# Patient Record
Sex: Male | Born: 1950 | Race: White | Hispanic: No | Marital: Married | Smoking: Never smoker
Health system: Southern US, Community
[De-identification: ages and names within clinical notes are randomized; demographics above are authoritative.]

## PROBLEM LIST (undated history)

## (undated) DIAGNOSIS — I10 Essential (primary) hypertension: Secondary | ICD-10-CM

## (undated) DIAGNOSIS — E119 Type 2 diabetes mellitus without complications: Secondary | ICD-10-CM

## (undated) DIAGNOSIS — E7800 Pure hypercholesterolemia, unspecified: Secondary | ICD-10-CM

## (undated) HISTORY — DX: Pure hypercholesterolemia, unspecified: E78.00

## (undated) HISTORY — DX: Essential (primary) hypertension: I10

## (undated) HISTORY — DX: Type 2 diabetes mellitus without complications: E11.9

---

## 2003-09-20 ENCOUNTER — Ambulatory Visit (HOSPITAL_COMMUNITY): Admission: RE | Admit: 2003-09-20 | Discharge: 2003-09-20 | Payer: Self-pay | Admitting: Orthopedic Surgery

## 2004-06-06 ENCOUNTER — Ambulatory Visit (HOSPITAL_COMMUNITY): Admission: RE | Admit: 2004-06-06 | Discharge: 2004-06-07 | Payer: Self-pay | Admitting: Orthopaedic Surgery

## 2005-03-05 ENCOUNTER — Ambulatory Visit (HOSPITAL_COMMUNITY): Admission: RE | Admit: 2005-03-05 | Discharge: 2005-03-05 | Payer: Self-pay | Admitting: Orthopedic Surgery

## 2005-03-05 ENCOUNTER — Ambulatory Visit (HOSPITAL_BASED_OUTPATIENT_CLINIC_OR_DEPARTMENT_OTHER): Admission: RE | Admit: 2005-03-05 | Discharge: 2005-03-05 | Payer: Self-pay | Admitting: Orthopedic Surgery

## 2006-12-03 ENCOUNTER — Encounter: Admission: RE | Admit: 2006-12-03 | Discharge: 2006-12-03 | Payer: Self-pay | Admitting: Orthopedic Surgery

## 2010-12-07 NOTE — H&P (Signed)
NAMEANGELO, PRINDLE             ACCOUNT NO.:  [id number]   MEDICAL RECORD NO.:  [id number]          PATIENT TYPE:  OIB   LOCATION:  NA                           FACILITY:  [facility]   PHYSICIAN:  Sharolyn Douglas, M.D.        DATE OF BIRTH:  [birth year]   DATE OF ADMISSION:  06/06/2004  DATE OF DISCHARGE:                                HISTORY & PHYSICAL   CHIEF COMPLAINT:  Pain in my neck and shoulder.   HISTORY OF PRESENT ILLNESS:  This 60 year old white male is seen by [place] for  continued progressive problems concerning pain into his neck and shoulder  area.  This was more so on the left than the right.  He had been seen by Dr.  Vania Rea. Supple and underwent cervical decompression with a distal clavicle  resection in April of the left shoulder with some improvement.  Unfortunately, he continued with pain and discomfort. He was referred to [place]  by Dr. Vania Rea. Supple to rule out any cervical problems.  Unfortunately, he  did show degenerative changes most specifically at the C6/7 area level.  Foraminal stenosis was also seen.  MRI showed spondylosis and degenerative  disc disease. This was again at the C6/7 region.  The patient has developed  some mild limitation with range of motion of the cervical spine.  Spurling's  test is positive.  He has no gross weakness of the upper other than  decreased biceps flexion on the right.   Due to the persistent nature of this and despite the fact that his shoulder  surgery has not relieved his overall discomfort, it was felt he would  benefit with surgical intervention.  He is being admitted for anterior  cervical discectomy and fusion of C6 and C7 with plates and allograft.   PAST MEDICAL HISTORY:  This gentleman has been in relatively good health  throughout his life time.  His family physician is Dr. Aida Puffer in  [place], [place] who is treating him for hypertension, hyperlipidemia,  noninsulin-dependent diabetes mellitus, and gout.  He is  unsure of his  current medications but he takes a medication for each of the above.  He  will bring his list of medications to the hospital with him at the time of  his preoperative visit.   PAST SURGICAL HISTORY:  1.  Some sort of renal surgery back in 1969.  2.  Ankle surgery by Dr. Dionne Ano. Gramig in the not too distant past.  3.  Shoulder surgery in 2005 by Dr. Vania Rea. Supple as mentioned above.   FAMILY HISTORY:  Positive for hypertension and diabetes.  Negative for heart  disease.   SOCIAL HISTORY:  The patient is married.  He is a Location manager.  He has  no intake of tobacco or alcohol products.  No children.  His caregiver will  be his wife after surgery.   REVIEW OF SYMPTOMS:  CNS:  No seizure, paralysis, or double vision.  RESPIRATORY:  No productive cough.  No hemoptysis and no shortness of  breath.  GASTROINTESTINAL:  No nausea, vomiting,  melena, or bloody stool.  GENITOURINARY:  No discharge, hematuria, dysuria.  MUSCULOSKELETAL:  Primarily in present illness.   PHYSICAL EXAMINATION:  GENERAL:  Alert, cooperative and friendly.  Muscular,  well-dressed, and fully alert 60 year old white male.  VITAL SIGNS:  Blood pressure 140/88, pulse 80, respirations are 12.  HEENT:  Normocephalic.  PERRLA.  EOMI.  Oropharynx is clear.  NECK:  Supple.  No lymphadenopathy.  CHEST:  Clear to auscultation.  No rhonchi and no rales.  HEART:  Regular rate and rhythm.  No murmurs are heard.  ABDOMEN:  Somewhat obese, soft, nontender.  No liver tenderness.  No  abdominal tenderness.  Bowel sounds are present.  GENITOURINARY:  Not done and not pertinent to present illness.  EXTREMITIES:  Upper extremity as in present illness above as well as  cervical spine.   ADMISSION DIAGNOSES:  1.  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  2.  Noninsulin-dependent diabetes mellitus.  3.  Hyperlipidemia.  4.  Hypertension.  5.   Gout.   PLAN:  The patient will undergo anterior cervical discectomy with fusion at  C6/C7 with plates and allograft.  As I understand it from the patient, his  physician, Dr. Gaspar Garbe B. Little (who is hesitant to give him clearance for  surgery primarily because of his hyperlipidemia) wanted him to get his HDL  before the surgery and his glucose was high.  The patient said it was 140 at  his last test.  It is felt during this examination based on hopefully  relatively normal studies that preoperative labs that the patient can go  ahead with surgery.  We discussed further his hospital stay and  perioperative course.  Dr. Sharolyn Douglas has discussed with him the risks and  benefits of the surgery.  He is fitted today with a soft cervical collar  which he will use postoperatively.       DLU/MEDQ  D:  05/31/2004  T:  05/31/2004  Job:  [id number]

## 2010-12-07 NOTE — Op Note (Signed)
David Houston, David Houston             ACCOUNT NO.:  [id number]   MEDICAL RECORD NO.:  [id number]          PATIENT TYPE:  OIB   LOCATION:  5004                         FACILITY:  [facility]   PHYSICIAN:  Sharolyn Douglas, M.D.        DATE OF BIRTH:  11/08/1950   DATE OF PROCEDURE:  06/06/2004  DATE OF DISCHARGE:  06/07/2004                                 OPERATIVE REPORT   ADMISSION DIAGNOSIS:  Cervical spondylotic radiculopathy.   PROCEDURE:  1.  Anterior cervical diskectomy C6-7.  2.  Anterior cervical arthrodesis.  3.  C6-7 placement of allograft prosthesis spacer packed with local      autogenous bone graft.  4.  Anterior cervical plating C6-7 using the spine system.   SURGEON:  Sharolyn Douglas, M.D.   ASSISTANT:  Verlin Fester, P.A.   ANESTHESIA:  General endotracheal.   COMPLICATIONS:  None.   INDICATIONS:  The patient is a pleasant 60 year old male with persistent  neck and the left upper extremity pain.  He has cervical spondylosis,  foraminal narrowing at C6-7 and elected to undergo ACDF in hopes of  relieving his symptoms. Risks and benefits reviewed.   DESCRIPTION OF PROCEDURE:  The patient was identified holding it in the  operating room underwent general endotracheal anesthesia without difficulty.  Given prophylactic IV antibiotics.  Carefully positioned on the operating  room table with Mayfield head rest, 5 pounds halter traction applied. Neck  prepped, draped usual sterile fashion.  Transverse incision made left side  of the neck level of the cricoid cartilage.  Dissection was carried sharply  through the platysma interval between the SCM and strap muscles medially  developed down the prevertebral space.  The trachea and carotid sheath  identified and protected all times.  Spinal needle placed, and  intraoperative x-ray confirmed appropriate location.  The longus coli  muscles elevated out of the C6-7 disk space bilaterally.  Caspar distraction  pins were placed into the C6-C7  vertebral bodies. Distraction was applied.  Diskectomies carried back to posterior longitudinal ligament.  Wide  foraminotomies were completed.  Allograft prosthesis spacer was then packed  with local bone graft obtained from the drill shavings and carefully  countersunk into the interspace 1 mm.  Anterior cervical plate then applied  using four 12 mm screws. We ensured that the screw mechanism locked.  The  wound was irrigated.  The esophagus, trachea, carotid sheath were examined.  There was no apparent injuries.  Penrose drain left in place. The layer  closed with 2-0 Vicryl suture.  Subcutaneous layer closed with 3-0 followed  by  a running 4-0 subcuticular Vicryl suture on skin edges. Benzoin, Steri-  Strips placed. Sterile dressing applied. The patient was placed through a  cervical collar and extubated without difficulty, transferred to recovery in  stable condition able to move his upper and lower extremities.      MC/MEDQ  D:  11/08/2004  T:  11/09/2004  Job:  [id number]   cc:   Sharolyn Douglas, M.D.  [address]  [place]  [place] [address]  Fax: [phone]

## 2010-12-07 NOTE — Op Note (Signed)
David Houston, David Houston             ACCOUNT NO.:  [id number]   MEDICAL RECORD NO.:  [id number]          PATIENT TYPE:  AMB   LOCATION:  [facility]                         [facility]ACILITY:  [facility]   PHYSICIAN:  Marlowe Kays, M.D.  DATE OF BIRTH:  [birth year]   DATE OF PROCEDURE:  03/05/2005  DATE OF DISCHARGE:                                 OPERATIVE REPORT   PREOPERATIVE DIAGNOSES:  Metallic foreign body left extensor forearm.   POSTOPERATIVE DIAGNOSES:  Metallic foreign body left extensor forearm.   PROCEDURE:  Removal of metallic foreign body (small piece of fine wire) left  extensor forearm.   SURGEON:  Marlowe Kays, M.D.   ASSISTANT:  Nurse.   ANESTHESIA:  General.   PATHOLOGY AND JUSTIFICATION FOR PROCEDURE:  The original injury was on the  job on February 15, 2005. He has had x-rays demonstrating a very small piece of  metallic foreign body in the extensor forearm. Because of persistent pain,  he is here today desiring removal.   DESCRIPTION OF PROCEDURE:  Satisfied general anesthesia, pneumatic  tourniquet, arm was esmarched out sterilely and prepped from tourniquet to  wrist with DuraPrep, draped in a sterile field. I initially used the mini C-  arm and we thought we had localized the foreign body more on the extensor  ulnar side than I would have anticipated. I made a small incision there, but  we could not find the foreign body and since this was not his area  tenderness, I brought in the main C-arm and with a good bit of searching and  playing with the magnification and contrast, we were able to find what  appeared to be the foreign body lying more in the area of tenderness. This  was marked out with instruments and I then made a small incision there and  did indeed find a small foreign piece of metal right beneath the  subcutaneous layer. After removing this, it did fragment. The patient wanted  some and we gave him the portion that we could find. It was just so small  and  fragile that it was difficult to even find it on the operating  instrument table. We did document the removal with x-rays which were given  to the patient as well as I am keeping copies. The wounds were then  irrigated with sterile saline. The original wound was reapproximated with  interrupted 3-0 Vicryl in the subcutaneous tissue and 4-0 nylon in the skin  and the second with 4-0 nylon in the skin. Betadine Adaptic dry sterile  dressing were applied, tourniquet was released. He tolerated the procedure  well and was taken to the recovery room in satisfactory condition with no  known complications.           ______________________________  Marlowe Kays, M.D.     JA/MEDQ  D:  03/05/2005  T:  03/05/2005  Job:  [id number]

## 2020-04-17 ENCOUNTER — Encounter (HOSPITAL_COMMUNITY): Payer: Self-pay | Admitting: *Deleted

## 2020-04-17 ENCOUNTER — Emergency Department (HOSPITAL_COMMUNITY)
Admission: EM | Admit: 2020-04-17 | Discharge: 2020-04-18 | Disposition: A | Discharge status: Home / Self Care | Payer: Medicare Other | Attending: Emergency Medicine | Admitting: Emergency Medicine

## 2020-04-17 ENCOUNTER — Emergency Department (HOSPITAL_COMMUNITY): Payer: Medicare Other

## 2020-04-17 ENCOUNTER — Other Ambulatory Visit: Payer: Self-pay

## 2020-04-17 DIAGNOSIS — I10 Essential (primary) hypertension: Secondary | ICD-10-CM | POA: Insufficient documentation

## 2020-04-17 DIAGNOSIS — Z23 Encounter for immunization: Secondary | ICD-10-CM | POA: Diagnosis not present

## 2020-04-17 DIAGNOSIS — U071 COVID-19: Secondary | ICD-10-CM | POA: Insufficient documentation

## 2020-04-17 DIAGNOSIS — E119 Type 2 diabetes mellitus without complications: Secondary | ICD-10-CM | POA: Diagnosis not present

## 2020-04-17 DIAGNOSIS — R509 Fever, unspecified: Secondary | ICD-10-CM | POA: Diagnosis present

## 2020-04-17 LAB — CBC WITH DIFFERENTIAL/PLATELET
Abs Immature Granulocytes: 0.05 10*3/uL (ref 0.00–0.07)
Basophils Absolute: 0 10*3/uL (ref 0.0–0.1)
Basophils Relative: 0 %
Eosinophils Absolute: 0 10*3/uL (ref 0.0–0.5)
Eosinophils Relative: 0 %
HCT: 42.4 % (ref 39.0–52.0)
Hemoglobin: 13.8 g/dL (ref 13.0–17.0)
Immature Granulocytes: 1 %
Lymphocytes Relative: 25 %
Lymphs Abs: 1.7 10*3/uL (ref 0.7–4.0)
MCH: 28.9 pg (ref 26.0–34.0)
MCHC: 32.5 g/dL (ref 30.0–36.0)
MCV: 88.7 fL (ref 80.0–100.0)
Monocytes Absolute: 0.8 10*3/uL (ref 0.1–1.0)
Monocytes Relative: 11 %
Neutro Abs: 4.4 10*3/uL (ref 1.7–7.7)
Neutrophils Relative %: 63 %
Platelets: 174 10*3/uL (ref 150–400)
RBC: 4.78 MIL/uL (ref 4.22–5.81)
RDW: 13.4 % (ref 11.5–15.5)
WBC: 7 10*3/uL (ref 4.0–10.5)
nRBC: 0 % (ref 0.0–0.2)

## 2020-04-17 LAB — COMPREHENSIVE METABOLIC PANEL
ALT: 35 U/L (ref 0–44)
AST: 55 U/L — ABNORMAL HIGH (ref 15–41)
Albumin: 3.9 g/dL (ref 3.5–5.0)
Alkaline Phosphatase: 40 U/L (ref 38–126)
Anion gap: 13 (ref 5–15)
BUN: 21 mg/dL (ref 8–23)
CO2: 30 mmol/L (ref 22–32)
Calcium: 8.9 mg/dL (ref 8.9–10.3)
Chloride: 92 mmol/L — ABNORMAL LOW (ref 98–111)
Creatinine, Ser: 1.03 mg/dL (ref 0.61–1.24)
GFR calc Af Amer: >60 mL/min (ref >60)
GFR calc non Af Amer: >60 mL/min (ref >60)
Glucose, Bld: 77 mg/dL (ref 70–99)
Potassium: 3.9 mmol/L (ref 3.5–5.1)
Sodium: 135 mmol/L (ref 135–145)
Total Bilirubin: 1.1 mg/dL (ref 0.3–1.2)
Total Protein: 7.1 g/dL (ref 6.5–8.1)

## 2020-04-17 MED ORDER — ACETAMINOPHEN 325 MG PO TABS
650.0000 mg | ORAL_TABLET | Freq: Once | ORAL | Status: AC | PRN
  Administered 2020-04-17: 650 mg via ORAL
  Filled 2020-04-17: qty 2

## 2020-04-17 NOTE — ED Triage Notes (Signed)
Pt sent here from [facility]. Pt having bodyaches, fever, cough x 1 week. Tested covid + today and sent for further eval. Has fever on arrival, spo2 99%.

## 2020-04-18 DIAGNOSIS — R509 Fever, unspecified: Secondary | ICD-10-CM | POA: Diagnosis present

## 2020-04-18 DIAGNOSIS — Z23 Encounter for immunization: Secondary | ICD-10-CM | POA: Diagnosis not present

## 2020-04-18 DIAGNOSIS — U071 COVID-19: Secondary | ICD-10-CM | POA: Diagnosis not present

## 2020-04-18 MED ORDER — SODIUM CHLORIDE 0.9 % IV SOLN
INTRAVENOUS | Status: DC | PRN
Start: 2020-04-18 — End: 2020-04-18

## 2020-04-18 MED ORDER — METHYLPREDNISOLONE SODIUM SUCC 125 MG IJ SOLR: 125.0000 mg | Freq: Once | INTRAMUSCULAR | Status: DC | PRN

## 2020-04-18 MED ORDER — FAMOTIDINE IN NACL 20-0.9 MG/50ML-% IV SOLN: 20.0000 mg | Freq: Once | INTRAVENOUS | Status: DC | PRN

## 2020-04-18 MED ORDER — SODIUM CHLORIDE 0.9 % IV SOLN
Freq: Once | INTRAVENOUS | Status: AC
  Filled 2020-04-18: qty 20

## 2020-04-18 MED ORDER — BENZONATATE 100 MG PO CAPS: 100.0000 mg | ORAL_CAPSULE | Freq: Three times a day (TID) | ORAL | 0 refills | Status: AC

## 2020-04-18 MED ORDER — ACETAMINOPHEN 325 MG PO TABS
650.0000 mg | ORAL_TABLET | Freq: Once | ORAL | Status: AC | PRN
  Administered 2020-04-18: 650 mg via ORAL
  Filled 2020-04-18: qty 2

## 2020-04-18 MED ORDER — SODIUM CHLORIDE 0.9 % IV SOLN: 1200.0000 mg | Freq: Once | INTRAVENOUS | Status: DC

## 2020-04-18 MED ORDER — DIPHENHYDRAMINE HCL 50 MG/ML IJ SOLN: 50.0000 mg | Freq: Once | INTRAMUSCULAR | Status: DC | PRN

## 2020-04-18 MED ORDER — EPINEPHRINE 0.3 MG/0.3ML IJ SOAJ
0.3000 mg | Freq: Once | INTRAMUSCULAR | Status: DC | PRN
Start: 2020-04-18 — End: 2020-04-18

## 2020-04-18 MED ORDER — ALBUTEROL SULFATE HFA 108 (90 BASE) MCG/ACT IN AERS: 2.0000 | INHALATION_SPRAY | Freq: Once | RESPIRATORY_TRACT | Status: DC | PRN

## 2020-04-18 NOTE — ED Provider Notes (Signed)
[facility] EMERGENCY DEPARTMENT Provider Note  CSN: [id number] Arrival date & time: 04/17/20  1805     History Chief Complaint  Patient presents with  . Covid Positive    David Houston is a 69 y.o. male with a history of hypertension, hypercholesterolemia, & DM who presents to the ED from urgent care for possible MAB infusion. Patient states he has been feeling poorly with fever, chills, body aches, generalized weakness, intermittent diarrhea, and dry cough x 1 week. He went to urgent care today where he tested positive for COVID 19 and was told to come to the ED for possible MAB infusion. No alleviating/aggravating factors to his sxs. No intervention PTA. Patient is not vaccinated against COVID 19. He denies chest pain, dyspnea, hemoptysis, leg pain/swelling, recent long travel, recent surgery or hospitalization, prior cancer, or prior blood clot.   HPI     Past Medical History:  Diagnosis Date  . Diabetes mellitus without complication (HCC)   . High cholesterol   . Hypertension     There are no problems to display for this patient.   History reviewed. No pertinent surgical history.     History reviewed. No pertinent family history.  Social History   Tobacco Use  . Smoking status: Never Smoker  Substance Use Topics  . Alcohol use: Not on file  . Drug use: Not on file    Home Medications Prior to Admission medications   Not on File    Allergies    Patient has no known allergies.  Review of Systems   Review of Systems  Constitutional: Positive for chills, fatigue and fever.  Respiratory: Positive for cough. Negative for shortness of breath.   Cardiovascular: Negative for chest pain.  Gastrointestinal: Positive for diarrhea. Negative for abdominal pain, anal bleeding, blood in stool, constipation, nausea and vomiting.  Genitourinary: Negative for dysuria.  Neurological: Positive for weakness.  All other systems reviewed and are  negative.   Physical Exam Updated Vital Signs BP (!) 122/110 (BP Location: Left Arm)   Pulse 98   Temp (!) 100.5 F (38.1 C) (Oral)   Resp 18   Ht 6\' 4"  (1.93 m)   Wt 95.3 kg   SpO2 97%   BMI 25.56 kg/m   Physical Exam Vitals and nursing note reviewed.  Constitutional:      General: He is not in acute distress.    Appearance: He is well-developed. He is not toxic-appearing.  HENT:     Head: Normocephalic and atraumatic.  Eyes:     General:        Right eye: No discharge.        Left eye: No discharge.     Conjunctiva/sclera: Conjunctivae normal.  Cardiovascular:     Rate and Rhythm: Normal rate and regular rhythm.  Pulmonary:     Effort: Pulmonary effort is normal. No respiratory distress.     Breath sounds: Rhonchi (at the bases) present. No wheezing or rales.  Abdominal:     General: There is no distension.     Palpations: Abdomen is soft.     Tenderness: There is no abdominal tenderness. There is no guarding or rebound.  Musculoskeletal:     Cervical back: Neck supple.     Comments: No significant peripheral edema.   Skin:    General: Skin is warm and dry.     Findings: No rash.  Neurological:     Mental Status: He is alert.     Comments:  Clear speech.   Psychiatric:        Behavior: Behavior normal.     ED Results / Procedures / Treatments   Labs (all labs ordered are listed, but only abnormal results are displayed) Labs Reviewed  COMPREHENSIVE METABOLIC PANEL - Abnormal; Notable for the following components:      Result Value   Chloride 92 (*)    AST 55 (*)    All other components within normal limits  CBC WITH DIFFERENTIAL/PLATELET    EKG None  Radiology DG Chest Portable 1 View  Result Date: 04/17/2020 CLINICAL DATA:  COVID-19 positive, fever, shortness of breath EXAM: PORTABLE CHEST 1 VIEW COMPARISON:  Radiograph 06/05/2004 FINDINGS: Streaky and ill-defined opacities are present in the left mid to lower lung periphery. Lungs are otherwise  clear. No pneumothorax or effusion. The cardiomediastinal contours are unremarkable. No acute osseous or soft tissue abnormality. Degenerative changes are present in the imaged spine and shoulders. Prior cervical fusion. IMPRESSION: Streaky and ill-defined opacities in the left mid to lower lung periphery, suspect some atelectatic change with low volumes versus early pneumonia/airspace disease in the setting of COVID-19 and fever. Electronically Signed   By: Kreg Shropshire M.D.   On: 04/17/2020 19:40    Procedures Procedures (including critical care time)  Medications Ordered in ED Medications  acetaminophen (TYLENOL) tablet 650 mg (has no administration in time range)  acetaminophen (TYLENOL) tablet 650 mg (650 mg Oral Given 04/17/20 1918)    ED Course  I have reviewed the triage vital signs and the nursing notes.  Pertinent labs & imaging results that were available during my care of the patient were reviewed by me and considered in my medical decision making (see chart for details).  David Houston was evaluated in Emergency Department on 04/18/2020 for the symptoms described in the history of present illness. He/she was evaluated in the context of the global COVID-19 pandemic, which necessitated consideration that the patient might be at risk for infection with the SARS-CoV-2 virus that causes COVID-19. Institutional protocols and algorithms that pertain to the evaluation of patients at risk for COVID-19 are in a state of rapid change based on information released by regulatory bodies including the CDC and federal and state organizations. These policies and algorithms were followed during the patient's care in the ED.  MDM Rules/Calculators/A&P                         Patient presents to the ED with constitutional sxs & cough x 1 week with positive COVID 19 testing.  On arrival he is febrile, vitals otherwise fairly unremarkable. Exam with some rhonchi @ the bases, no respiratory distress. I  personally ambulated patient throughout exam room with SPO2 maintaining 95-99%, briefly desaturated to 91% with a coughing spell with quick return of SpO2 to 95%.   Additional history obtained:  Additional history obtained from chart review & nursing note review.  Lab Tests:  I reviewed and interpreted labs, which included:  CBC & CMP: fairly unremarkable.  Imaging Studies ordered:  CXR ordered per triage, I independently visualized and interpreted imaging which showed streaky & ill defined opacities in the left mid to lower lung periphery- atelectasis w/ low volumes vs. Early pneumonia/airspace disease in the setting of COVID 19 & fever per radiology which I am in agreement with.   Patient without chest pain or dyspnea, low risk wells- doubt PE.  CXR w/o CHF, pneumothoraxi, or effusion.  Likely symptomatic  from COVID 19. Not significantly hypoxic w/ ambulation, does not appear to require admission at this time.  Discussed risks/benefit of MAB infusion in the ED, patient elected to receive this- meets criteria w/ comorbidities.   Tolerated well, will discharge home with tessalon to take as needed. I discussed results, treatment plan, need for follow-up, and return precautions with the patient. Provided opportunity for questions, patient confirmed understanding and is in agreement with plan.   Findings and plan of care discussed with supervising physician Dr. Denton Lank who is in agreement.   Portions of this note were generated with Scientist, clinical (histocompatibility and immunogenetics). Dictation errors may occur despite best attempts at proofreading.  Final Clinical Impression(s) / ED Diagnoses Final diagnoses:  COVID-19    Rx / DC Orders ED Discharge Orders         Ordered    benzonatate (TESSALON) 100 MG capsule  Every 8 hours        04/18/20 1305           Libia Fazzini, Pleas Koch, PA-C 04/18/20 1312    Cathren Laine, MD 04/19/20 [id number]

## 2020-04-18 NOTE — Discharge Instructions (Addendum)
You received the monoclonal antibody infusion today due to your covid 19 Your labs were overall normal or similar to prior blood work you have had done.  Your chest xray shows findings of COVID.   We are instructing patient's with COVID 19 or symptoms of COVID 19 to isolate themselves for 14 days. You may be able to discontinue self isolation if the following conditions are met:   Persons with COVID-19 who have symptoms and were directed to care for themselves at home may discontinue home isolation under the  following conditions: - It has been at least 7 days have passed since symptoms first appeared. - AND at least 3 days (72 hours) have passed since recovery defined as resolution of fever without the use of fever-reducing medications and improvement in respiratory symptoms (e.g., cough, shortness of breath)  Please follow the below instructions.   We are sending you home with tessalon to take every 8 hours as needed for coughing.  Take tylenol per over the counter dosing for fever/pain.   We have prescribed you new medication(s) today. Discuss the medications prescribed today with your pharmacist as they can have adverse effects and interactions with your other medicines including over the counter and prescribed medications. Seek medical evaluation if you start to experience new or abnormal symptoms after taking one of these medicines, seek care immediately if you start to experience difficulty breathing, feeling of your throat closing, facial swelling, or rash as these could be indications of a more serious allergic reaction   Please follow up with primary care within 3-5 days for re-evaluation- call prior to going to the office to make them aware of your symptoms as some offices are altering their method of seeing patients with COVID 19 symptoms. Return to the ER for new or worsening symptoms including but not limited to increased work of breathing, chest pain, passing out, or any other  concerns.       Person Under Monitoring Name: David Houston  Location: [place] [address]   Infection Prevention Recommendations for Individuals Confirmed to have, or Being Evaluated for, 2019 Novel Coronavirus (COVID-19) Infection Who Receive Care at Home  Individuals who are confirmed to have, or are being evaluated for, COVID-19 should follow the prevention steps below until a healthcare provider or local or state health department says they can return to normal activities.  Stay home except to get medical care You should restrict activities outside your home, except for getting medical care. Do not go to work, school, or public areas, and do not use public transportation or taxis.  Call ahead before visiting your doctor Before your medical appointment, call the healthcare provider and tell them that you have, or are being evaluated for, COVID-19 infection. This will help the healthcare provider's office take steps to keep other people from getting infected. Ask your healthcare provider to call the local or state health department.  Monitor your symptoms Seek prompt medical attention if your illness is worsening (e.g., difficulty breathing). Before going to your medical appointment, call the healthcare provider and tell them that you have, or are being evaluated for, COVID-19 infection. Ask your healthcare provider to call the local or state health department.  Wear a facemask You should wear a facemask that covers your nose and mouth when you are in the same room with other people and when you visit a healthcare provider. People who live with or visit you should also wear a facemask while they  are in the same room with you.  Separate yourself from other people in your home As much as possible, you should stay in a different room from other people in your home. Also, you should use a separate bathroom, if available.  Avoid sharing household  items You should not share dishes, drinking glasses, cups, eating utensils, towels, bedding, or other items with other people in your home. After using these items, you should wash them thoroughly with soap and water.  Cover your coughs and sneezes Cover your mouth and nose with a tissue when you cough or sneeze, or you can cough or sneeze into your sleeve. Throw used tissues in a lined trash can, and immediately wash your hands with soap and water for at least 20 seconds or use an alcohol-based hand rub.  Wash your [facility] your hands often and thoroughly with soap and water for at least 20 seconds. You can use an alcohol-based hand sanitizer if soap and water are not available and if your hands are not visibly dirty. Avoid touching your eyes, nose, and mouth with unwashed hands.   Prevention Steps for Caregivers and Household Members of Individuals Confirmed to have, or Being Evaluated for, COVID-19 Infection Being Cared for in the Home  If you live with, or provide care at home for, a person confirmed to have, or being evaluated for, COVID-19 infection please follow these guidelines to prevent infection:  Follow healthcare provider's instructions Make sure that you understand and can help the patient follow any healthcare provider instructions for all care.  Provide for the patient's basic needs You should help the patient with basic needs in the home and provide support for getting groceries, prescriptions, and other personal needs.  Monitor the patient's symptoms If they are getting sicker, call his or her medical provider and tell them that the patient has, or is being evaluated for, COVID-19 infection. This will help the healthcare provider's office take steps to keep other people from getting infected. Ask the healthcare provider to call the local or state health department.  Limit the number of people who have contact with the patient If possible, have only one caregiver  for the patient. Other household members should stay in another home or place of residence. If this is not possible, they should stay in another room, or be separated from the patient as much as possible. Use a separate bathroom, if available. Restrict visitors who do not have an essential need to be in the home.  Keep older adults, very young children, and other sick people away from the patient Keep older adults, very young children, and those who have compromised immune systems or chronic health conditions away from the patient. This includes people with chronic heart, lung, or kidney conditions, diabetes, and cancer.  Ensure good ventilation Make sure that shared spaces in the home have good air flow, such as from an air conditioner or an opened window, weather permitting.  Wash your hands often Wash your hands often and thoroughly with soap and water for at least 20 seconds. You can use an alcohol based hand sanitizer if soap and water are not available and if your hands are not visibly dirty. Avoid touching your eyes, nose, and mouth with unwashed hands. Use disposable paper towels to dry your hands. If not available, use dedicated cloth towels and replace them when they become wet.  Wear a facemask and gloves Wear a disposable facemask at all times in the room and gloves when  you touch or have contact with the patient's blood, body fluids, and/or secretions or excretions, such as sweat, saliva, sputum, nasal mucus, vomit, urine, or feces.  Ensure the mask fits over your nose and mouth tightly, and do not touch it during use. Throw out disposable facemasks and gloves after using them. Do not reuse. Wash your hands immediately after removing your facemask and gloves. If your personal clothing becomes contaminated, carefully remove clothing and launder. Wash your hands after handling contaminated clothing. Place all used disposable facemasks, gloves, and other waste in a lined container  before disposing them with other household waste. Remove gloves and wash your hands immediately after handling these items.  Do not share dishes, glasses, or other household items with the patient Avoid sharing household items. You should not share dishes, drinking glasses, cups, eating utensils, towels, bedding, or other items with a patient who is confirmed to have, or being evaluated for, COVID-19 infection. After the person uses these items, you should wash them thoroughly with soap and water.  Wash laundry thoroughly Immediately remove and wash clothes or bedding that have blood, body fluids, and/or secretions or excretions, such as sweat, saliva, sputum, nasal mucus, vomit, urine, or feces, on them. Wear gloves when handling laundry from the patient. Read and follow directions on labels of laundry or clothing items and detergent. In general, wash and dry with the warmest temperatures recommended on the label.  Clean all areas the individual has used often Clean all touchable surfaces, such as counters, tabletops, doorknobs, bathroom fixtures, toilets, phones, keyboards, tablets, and bedside tables, every day. Also, clean any surfaces that may have blood, body fluids, and/or secretions or excretions on them. Wear gloves when cleaning surfaces the patient has come in contact with. Use a diluted bleach solution (e.g., dilute bleach with 1 part bleach and 10 parts water) or a household disinfectant with a label that says EPA-registered for coronaviruses. To make a bleach solution at home, add 1 tablespoon of bleach to 1 quart (4 cups) of water. For a larger supply, add  cup of bleach to 1 gallon (16 cups) of water. Read labels of cleaning products and follow recommendations provided on product labels. Labels contain instructions for safe and effective use of the cleaning product including precautions you should take when applying the product, such as wearing gloves or eye protection and making  sure you have good ventilation during use of the product. Remove gloves and wash hands immediately after cleaning.  Monitor yourself for signs and symptoms of illness Caregivers and household members are considered close contacts, should monitor their health, and will be asked to limit movement outside of the home to the extent possible. Follow the monitoring steps for close contacts listed on the symptom monitoring form.   ? If you have additional questions, contact your local health department or call the epidemiologist on call at [phone] (available 24/7). ? This guidance is subject to change. For the most up-to-date guidance from [facility], please refer to their website: [email or web address]

## 2020-04-18 NOTE — ED Notes (Signed)
Patient verbalizes understanding of discharge instructions. Opportunity for questioning and answers were provided. Pt discharged from ED ambulatory.  

## 2021-04-25 ENCOUNTER — Encounter: Payer: Self-pay | Admitting: Gastroenterology

## 2022-04-17 IMAGING — DX DG CHEST 1V PORT
1 series · 1 of 1 positions shown · non-contrast
Comparison: Radiograph 06/05/2004

CLINICAL DATA: 0P19F-YS positive, fever, shortness of breath

EXAM:
PORTABLE CHEST 1 VIEW

[chest ap]
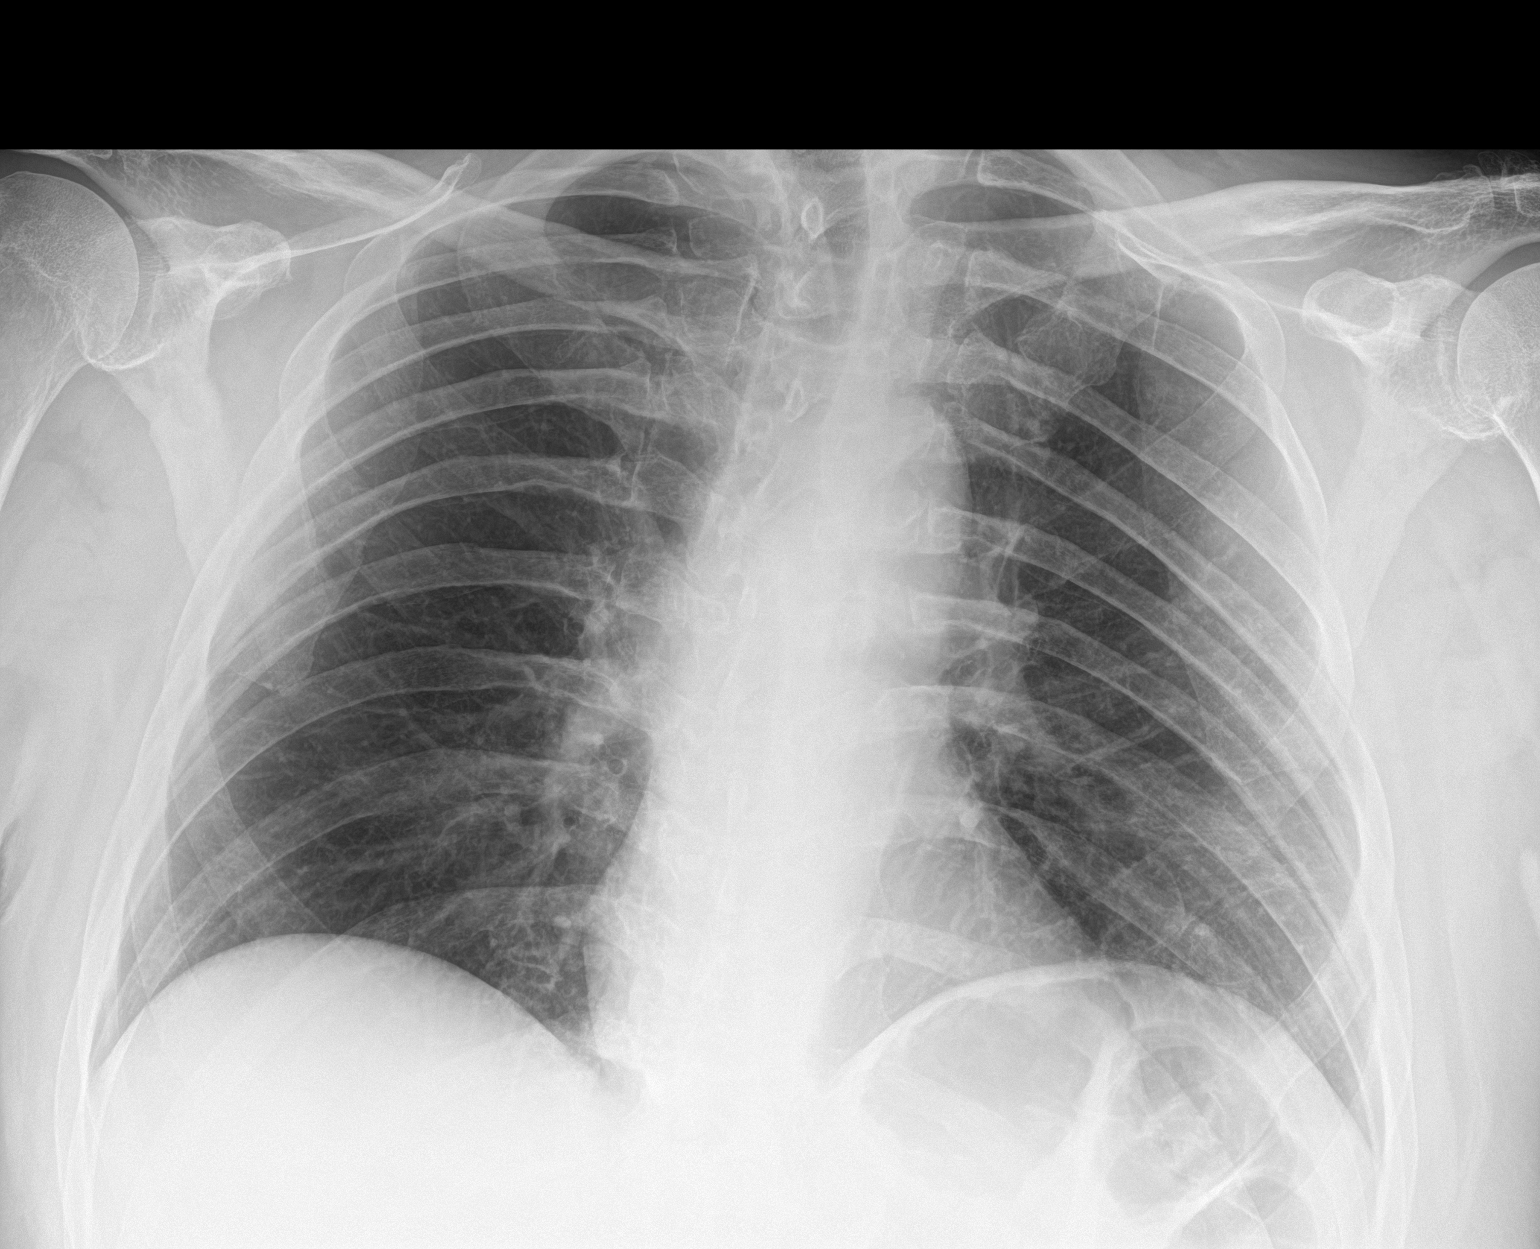

[1 of 1 positions shown; findings below may reference images not displayed]

FINDINGS: Streaky and ill-defined opacities are present in the left mid to
lower lung periphery. Lungs are otherwise clear. No pneumothorax or
effusion. The cardiomediastinal contours are unremarkable. No acute
osseous or soft tissue abnormality. Degenerative changes are present
in the imaged spine and shoulders. Prior cervical fusion.
IMPRESSION: Streaky and ill-defined opacities in the left mid to lower lung
periphery, suspect some atelectatic change with low volumes versus
early pneumonia/airspace disease in the setting of 0P19F-YS and
fever.
